# Patient Record
Sex: Female | Born: 2000 | Race: Black or African American | Hispanic: No | Marital: Single | Smoking: Never smoker
Health system: Southern US, Community
[De-identification: ages and names within clinical notes are randomized; demographics above are authoritative.]

## PROBLEM LIST (undated history)

## (undated) DIAGNOSIS — F419 Anxiety disorder, unspecified: Secondary | ICD-10-CM

## (undated) DIAGNOSIS — G039 Meningitis, unspecified: Secondary | ICD-10-CM

## (undated) HISTORY — DX: Meningitis, unspecified: G03.9

---
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
## 2018-03-08 ENCOUNTER — Encounter: Payer: Self-pay | Admitting: Physician Assistant

## 2020-10-03 ENCOUNTER — Ambulatory Visit
Admission: RE | Admit: 2020-10-03 | Discharge: 2020-10-03 | Disposition: A | Discharge status: Home / Self Care | Payer: BC Managed Care – PPO | Source: Ambulatory Visit | Attending: Emergency Medicine | Admitting: Emergency Medicine

## 2020-10-03 ENCOUNTER — Other Ambulatory Visit: Payer: Self-pay

## 2020-10-03 VITALS — BP 133/90 | HR 75 | Temp 98.1°F | Resp 16

## 2020-10-03 DIAGNOSIS — Z20822 Contact with and (suspected) exposure to covid-19: Secondary | ICD-10-CM

## 2020-10-03 DIAGNOSIS — J069 Acute upper respiratory infection, unspecified: Secondary | ICD-10-CM

## 2020-10-03 DIAGNOSIS — Z1152 Encounter for screening for COVID-19: Secondary | ICD-10-CM

## 2020-10-03 HISTORY — DX: Anxiety disorder, unspecified: F41.9

## 2020-10-03 NOTE — ED Triage Notes (Signed)
Patient in with complaints of runny nose,cough, body aches and headache x 2 days. Patient states that she had a productive cough with yellowish/green sputum that started this morning. Patient denies any fever. TheraFlu and Robitussin taken at home.

## 2020-10-03 NOTE — ED Provider Notes (Signed)
[facility]    CSN: [id number] Arrival date & time: 10/03/20  1401      History   Chief Complaint Chief Complaint  Patient presents with  . Cough  . Headache  . Nasal Congestion  . Generalized Body Aches  . Appointment    2 pm    HPI [place] Melanie a 19 y.o. Conway  Subjective:   Melanie Conway Melanie a 19 y.o. Conway here for evaluation of a cough.  The cough Melanie non-productive, without wheezing, dyspnea or hemoptysis and Melanie aggravated by cold air and pollens. Onset of symptoms was 2 days ago, stable since that time.  Associated symptoms include nasal congestion, ear popping. Patient does not have a history of asthma. Patient has not had recent travel. Patient does not have a history of smoking. Patient  has not had a previous chest x-ray. Patient has not had a PPD done. The following portions of the patient's history were reviewed and updated as appropriate: allergies, current medications, past family history, past medical history, past social history, past surgical history and problem list.      Past Medical History:  Diagnosis Date  . Anxiety     There are no problems to display for this patient.   History reviewed. No pertinent surgical history.  OB History   No obstetric history on file.      Home Medications    Prior to Admission medications   Not on File    Family History Family History  Problem Relation Age of Onset  . Anemia Mother     Social History Social History   Tobacco Use  . Smoking status: Never Smoker  . Smokeless tobacco: Never Used  Vaping Use  . Vaping Use: Never used  Substance Use Topics  . Alcohol use: Yes    Comment: a couple of times a week  . Drug use: Yes    Types: Marijuana    Comment: daily     Allergies   Patient has no known allergies.   Review of Systems Review of Systems  Constitutional: Negative for fatigue and fever.  HENT: Positive for congestion and ear pain. Negative for ear  discharge, sinus pain, sore throat and voice change.   Eyes: Negative for pain, redness and visual disturbance.  Respiratory: Positive for cough. Negative for shortness of breath.   Cardiovascular: Negative for chest pain and palpitations.  Gastrointestinal: Negative for abdominal pain, diarrhea and vomiting.  Musculoskeletal: Negative for arthralgias and myalgias.  Skin: Negative for rash and wound.  Neurological: Negative for syncope and headaches.     Physical Exam Triage Vital Signs ED Triage Vitals  Enc Vitals Group     BP      Pulse      Resp      Temp      Temp src      SpO2      Weight      Height      Head Circumference      Peak Flow      Pain Score      Pain Loc      Pain Edu?      Excl. in GC?    No data found.  Updated Vital Signs BP 133/90 (BP Location: Left Arm)   Pulse 75   Temp 98.1 F (36.7 C) (Oral)   Resp 16   LMP 09/30/2020   SpO2 99%   Visual Acuity Right Eye Distance:   Left Eye  Distance:   Bilateral Distance:    Right Eye Near:   Left Eye Near:    Bilateral Near:     Physical Exam Constitutional:      General: She Melanie not in acute distress.    Appearance: She Melanie not ill-appearing or diaphoretic.  HENT:     Head: Normocephalic and atraumatic.     Right Ear: Tympanic membrane and ear canal normal.     Left Ear: Tympanic membrane and ear canal normal.     Mouth/Throat:     Mouth: Mucous membranes are moist.     Pharynx: Oropharynx Melanie clear. No oropharyngeal exudate or posterior oropharyngeal erythema.  Eyes:     General: No scleral icterus.    Extraocular Movements: Extraocular movements intact.     Conjunctiva/sclera: Conjunctivae normal.     Pupils: Pupils are equal, round, and reactive to light.  Neck:     Comments: Trachea midline, negative JVD Cardiovascular:     Rate and Rhythm: Normal rate and regular rhythm.     Heart sounds: No murmur heard.  No gallop.   Pulmonary:     Effort: Pulmonary effort Melanie normal. No  respiratory distress.     Breath sounds: No wheezing, rhonchi or rales.  Musculoskeletal:     Cervical back: Neck supple. No tenderness.  Lymphadenopathy:     Cervical: No cervical adenopathy.  Skin:    Capillary Refill: Capillary refill takes less than 2 seconds.     Coloration: Skin Melanie not jaundiced or pale.     Findings: No rash.  Neurological:     General: No focal deficit present.     Mental Status: She Melanie alert and oriented to person, place, and time.      UC Treatments / Results  Labs (all labs ordered are listed, but only abnormal results are displayed) Labs Reviewed  NOVEL CORONAVIRUS, NAA    EKG   Radiology No results found.  Procedures Procedures (including critical care time)  Medications Ordered in UC Medications - No data to display  Initial Impression / Assessment and Plan / UC Course  I have reviewed the triage vital signs and the nursing notes.  Pertinent labs & imaging results that were available during my care of the patient were reviewed by me and considered in my medical decision making (see chart for details).     Patient afebrile, nontoxic, with SpO2 99%.  Covid PCR pending.  Patient to quarantine until results are back.  We will treat supportively as outlined below.  Return precautions discussed, patient verbalized understanding and Melanie agreeable to plan. Final Clinical Impressions(s) / UC Diagnoses   Final diagnoses:  URI with cough and congestion     Discharge Instructions     Your COVID test Melanie pending - it Melanie important to quarantine / isolate at home until your results are back. If you test positive and would like further evaluation for persistent or worsening symptoms, you may schedule an E-visit or virtual (video) visit throughout the [facility] app or website.  PLEASE NOTE: If you develop severe chest pain or shortness of breath please go to the ER or call 9-1-1 for further evaluation --> DO NOT schedule electronic or  virtual visits for this. Please call our office for further guidance / recommendations as needed.  For information about the Covid vaccine, please visit [email or web address]    ED Prescriptions    None     PDMP not reviewed this encounter.   [place], [place],  PA-C 10/03/20 1500

## 2020-10-03 NOTE — Discharge Instructions (Addendum)
Your COVID test is pending - it is important to quarantine / isolate at home until your results are back. °If you test positive and would like further evaluation for persistent or worsening symptoms, you may schedule an E-visit or virtual (video) visit throughout the [facility] MyChart app or website. ° °PLEASE NOTE: If you develop severe chest pain or shortness of breath please go to the ER or call 9-1-1 for further evaluation --> DO NOT schedule electronic or virtual visits for this. °Please call our office for further guidance / recommendations as needed. ° °For information about the Covid vaccine, please visit [email or web address] °

## 2020-10-04 ENCOUNTER — Encounter: Payer: Self-pay | Admitting: Physician Assistant

## 2020-10-05 LAB — SARS-COV-2, NAA 2 DAY TAT

## 2020-10-05 LAB — NOVEL CORONAVIRUS, NAA: SARS-CoV-2, NAA: NOT DETECTED

## 2023-12-17 ENCOUNTER — Other Ambulatory Visit: Payer: Self-pay

## 2023-12-17 ENCOUNTER — Encounter (HOSPITAL_BASED_OUTPATIENT_CLINIC_OR_DEPARTMENT_OTHER): Payer: Self-pay | Admitting: Emergency Medicine

## 2023-12-17 ENCOUNTER — Emergency Department (HOSPITAL_BASED_OUTPATIENT_CLINIC_OR_DEPARTMENT_OTHER)
Admission: EM | Admit: 2023-12-17 | Discharge: 2023-12-17 | Disposition: A | Discharge status: Home / Self Care | Payer: BC Managed Care – PPO | Attending: Emergency Medicine | Admitting: Emergency Medicine

## 2023-12-17 DIAGNOSIS — R1033 Periumbilical pain: Secondary | ICD-10-CM | POA: Diagnosis present

## 2023-12-17 NOTE — ED Provider Notes (Signed)
 [place] EMERGENCY DEPARTMENT AT [facility] Provider Note   CSN: [id number] Arrival date & time: 12/17/23  1118     History  Chief Complaint  Patient presents with   Possible Hernia    Melanie Conway is a 23 y.o. female.  Patient presents to the emergency department today for evaluation of periumbilical pain.  Patient has felt a small lump at the upper portion of the umbilicus.  This area is been tender to palpation.  No drainage.  No fevers.  No vomiting.  She has had some diarrhea which is chronically intermittent for the patient.  She was concerned that she developed a hernia she started feeling this after she did some lifting yesterday.  Swelling is more pronounced with standing.  She denies history of piercings or other trauma to the area.       Home Medications Prior to Admission medications   Medication Sig Start Date End Date Taking? Authorizing Provider  etonogestrel-ethinyl estradiol (NUVARING) 0.12-0.015 MG/24HR vaginal ring Place 1 each vaginally every 28 (twenty-eight) days. Insert vaginally and leave in place for 3 consecutive weeks, then remove for 1 week.    [provider]      Allergies    Patient has no known allergies.    Review of Systems   Review of Systems  Physical Exam Updated Vital Signs BP 120/80   Pulse 99   Temp 97.6 F (36.4 C)   Resp 16   Wt 81.6 kg   LMP 12/14/2023   SpO2 100%   BMI 29.95 kg/m  Physical Exam Vitals and nursing note reviewed.  Constitutional:      Appearance: She is well-developed.  HENT:     Head: Normocephalic and atraumatic.  Eyes:     Conjunctiva/sclera: Conjunctivae normal.  Pulmonary:     Effort: No respiratory distress.  Abdominal:     Tenderness: There is abdominal tenderness.     Comments: Patient with focal tenderness at the umbilicus, and the 11-12 o'clock position.  With standing, a small nodule is noted about the size of a pencil eraser.  This is not grossly apparent.  It feels  more cystic.  Not movable like a hernia would be.  No overlying erythema.  Not particularly fluctuant.  Musculoskeletal:     Cervical back: Normal range of motion and neck supple.  Skin:    General: Skin is warm and dry.  Neurological:     Mental Status: She is alert.     ED Results / Procedures / Treatments   Labs (all labs ordered are listed, but only abnormal results are displayed) Labs Reviewed - No data to display  EKG None  Radiology No results found.  Procedures Procedures    Medications Ordered in ED Medications - No data to display  ED Course/ Medical Decision Making/ A&P    Patient seen and examined. History obtained directly from patient.   Labs/EKG: None ordered  Imaging: Bedside ultrasound was used to evaluate for any fluid collections in the area of the small lump.  I do not see any significant collection of fluids or signs of associated cellulitis in the area.  EMERGENCY DEPARTMENT US  SOFT TISSUE INTERPRETATION Study: Limited Soft Tissue Ultrasound  INDICATIONS: Pain Multiple views of the body part were obtained in real-time with a multi-frequency linear probe  PERFORMED BY: Myself IMAGES ARCHIVED?: No SIDE:Midline BODY PART:Abdominal wall INTERPRETATION:  Normal soft tissue ultrasound   Medications/Fluids: None ordered  Most recent vital signs reviewed and are as  follows: BP 120/80   Pulse 99   Temp 97.6 F (36.4 C)   Resp 16   Wt 81.6 kg   LMP 12/14/2023   SpO2 100%   BMI 29.95 kg/m   Initial impression: Umbilical pain, possible small cyst, no indication for drainage, low concern for hernia  Home treatment plan: OTC anti-inflammatories/Tylenol, warm compress  Return instructions discussed with patient: We discussed possibility of the area becoming more enlarged, red, tender.  In this case she would need to return for recheck and consideration for possible aspiration.  Follow-up instructions discussed with patient: Follow-up with  PCP if not improved in 1 week                                Medical Decision Making  Patient with a small tender nodule at the upper portion of the umbilicus.  Area feels more cystic.  Does not appear to be particularly inflamed or infected this point.  No indication for drainage or antibiotics.  Cannot entirely rule out small fat-containing hernia, however appearance and feel on exam makes this less likely.  Patient certainly does not have any obstructive symptoms today.  Do not feel that CT would be beneficial at this time.        Final Clinical Impression(s) / ED Diagnoses Final diagnoses:  Umbilical pain    Rx / DC Orders ED Discharge Orders     None         Desiderio Chew, PA-C 12/17/23 1157    Dean Clarity, MD 12/17/23 1216

## 2023-12-17 NOTE — Discharge Instructions (Signed)
 Please use over the counter ibuprofen  or naproxen on a scheduled basis over the next few days.  You may also use warm compresses.  Return to the emergency department if you develop more pronounced swelling, redness, drainage around the bellybutton.  In this case you may have a cyst or abscess that needs to be drained.

## 2023-12-17 NOTE — ED Triage Notes (Signed)
 Pt c/o umbilicus pain, endorses concern for hernia.

## 2024-01-25 ENCOUNTER — Inpatient Hospital Stay: Admission: RE | Admit: 2024-01-25 | Payer: Self-pay | Source: Ambulatory Visit

## 2024-10-05 ENCOUNTER — Ambulatory Visit
Admission: EM | Admit: 2024-10-05 | Discharge: 2024-10-05 | Disposition: A | Discharge status: Home / Self Care | Attending: Family Medicine | Admitting: Family Medicine

## 2024-10-05 DIAGNOSIS — B9789 Other viral agents as the cause of diseases classified elsewhere: Secondary | ICD-10-CM

## 2024-10-05 DIAGNOSIS — J988 Other specified respiratory disorders: Secondary | ICD-10-CM

## 2024-10-05 LAB — POC COVID19/FLU A&B COMBO
Covid Antigen, POC: NEGATIVE
Influenza A Antigen, POC: NEGATIVE
Influenza B Antigen, POC: NEGATIVE

## 2024-10-05 MED ORDER — CETIRIZINE HCL 10 MG PO TABS: 10.0000 mg | ORAL_TABLET | Freq: Every day | ORAL | 0 refills | Status: AC

## 2024-10-05 MED ORDER — PSEUDOEPHEDRINE HCL 30 MG PO TABS: 30.0000 mg | ORAL_TABLET | Freq: Three times a day (TID) | ORAL | 0 refills | Status: AC | PRN

## 2024-10-05 MED ORDER — PROMETHAZINE-DM 6.25-15 MG/5ML PO SYRP: 5.0000 mL | ORAL_SOLUTION | Freq: Every evening | ORAL | 0 refills | Status: AC | PRN

## 2024-10-05 MED ORDER — IBUPROFEN 600 MG PO TABS: 600.0000 mg | ORAL_TABLET | Freq: Four times a day (QID) | ORAL | 0 refills | Status: AC | PRN

## 2024-10-05 NOTE — Discharge Instructions (Signed)
We will manage this as a viral respiratory illness. For sore throat or cough try using a honey-based tea. Use 3 teaspoons of honey with juice squeezed from half lemon. Place shaved pieces of ginger into 1/2-1 cup of water and warm over stove top. Then mix the ingredients and repeat every 4 hours as needed. Please take ibuprofen 600mg  every 6 hours with food alternating with OR taken together with Tylenol 500mg -650mg  every 6 hours for throat pain, fevers, aches and pains. Hydrate very well with at least 2 liters of water. Eat light meals such as soups (chicken and noodles, vegetable, chicken and wild rice).  Do not eat foods that you are allergic to.  Taking an antihistamine like Zyrtec (10mg  daily) can help against postnasal drainage, sinus congestion which can cause sinus pain, sinus headaches, throat pain, painful swallowing, coughing.  You can take this together with pseudoephedrine (Sudafed) at a dose of 30mg  3 times a day or twice daily as needed for the same kind of nasal drip, congestion.  Use cough syrup as needed.

## 2024-10-05 NOTE — ED Triage Notes (Signed)
 Pt c/o cough, head/chest congestion x 2 days-denies fever-taking theraflu and dayquil-NAD-steady gait

## 2024-10-05 NOTE — ED Provider Notes (Signed)
 [facility] - URGENT CARE C[facility]  Note:  This document was prepared using Conservation officer, historic buildings and may include unintentional dictation errors.  MRN: [id number] DOB: Dec 06, 2001  Subjective:   Melanie Conway is a 23 y.o. female presenting for 2 day history of bilateral ear fullness, sinus congestion, scratchy throat, chest congestion, labored breathing, mild chest pain. No asthma. No smoking of any kind including cigarettes, cigars, vaping, marijuana use.    No current facility-administered medications for this encounter.  Current Outpatient Medications:    busPIRone (BUSPAR) 5 MG tablet, TAKE 1 TABLET BY MOUTH TWICE A DAY AS NEEDED; Duration: 90, Disp: , Rfl:    escitalopram (LEXAPRO) 10 MG tablet, Take 10 mg by mouth daily., Disp: , Rfl:    etonogestrel-ethinyl estradiol (NUVARING) 0.12-0.015 MG/24HR vaginal ring, Place 1 each vaginally every 28 (twenty-eight) days. Insert vaginally and leave in place for 3 consecutive weeks, then remove for 1 week., Disp: , Rfl:    No Known Allergies  Past Medical History:  Diagnosis Date   Anxiety    Meningitis      Past Surgical History:  Procedure Laterality Date   WISDOM TOOTH EXTRACTION     per pt    Family History  Problem Relation Age of Onset   Anemia Mother    Allergies Brother    Cancer Maternal Grandfather     Social History   Tobacco Use   Smoking status: Never   Smokeless tobacco: Never  Vaping Use   Vaping status: Former  Substance Use Topics   Alcohol use: Yes    Comment: occ   Drug use: Not Currently    Types: Marijuana    ROS   Objective:   Vitals: BP 130/80 (BP Location: Left Arm)   Pulse 90   Temp 99.1 F (37.3 C) (Oral)   Resp 16   LMP 09/27/2024   SpO2 97%   Physical Exam Constitutional:      General: She is not in acute distress.    Appearance: Normal appearance. She is well-developed and normal weight. She is not ill-appearing, toxic-appearing or diaphoretic.  HENT:      Head: Normocephalic and atraumatic.     Right Ear: Tympanic membrane, ear canal and external ear normal. No drainage or tenderness. No middle ear effusion. There is no impacted cerumen. Tympanic membrane is not erythematous or bulging.     Left Ear: Tympanic membrane, ear canal and external ear normal. No drainage or tenderness.  No middle ear effusion. There is no impacted cerumen. Tympanic membrane is not erythematous or bulging.     Nose: Nose normal. No congestion or rhinorrhea.     Mouth/Throat:     Mouth: Mucous membranes are moist. No oral lesions.     Pharynx: No pharyngeal swelling, oropharyngeal exudate, posterior oropharyngeal erythema or uvula swelling.     Tonsils: No tonsillar exudate or tonsillar abscesses. 0 on the right. 0 on the left.  Eyes:     General: No scleral icterus.       Right eye: No discharge.        Left eye: No discharge.     Extraocular Movements: Extraocular movements intact.     Right eye: Normal extraocular motion.     Left eye: Normal extraocular motion.     Conjunctiva/sclera: Conjunctivae normal.  Cardiovascular:     Rate and Rhythm: Normal rate and regular rhythm.     Heart sounds: Normal heart sounds. No murmur heard.    No friction  rub. No gallop.  Pulmonary:     Effort: Pulmonary effort is normal. No respiratory distress.     Breath sounds: No stridor. No wheezing, rhonchi or rales.  Chest:     Chest wall: No tenderness.  Musculoskeletal:     Cervical back: Normal range of motion and neck supple.  Lymphadenopathy:     Cervical: No cervical adenopathy.  Skin:    General: Skin is warm and dry.  Neurological:     General: No focal deficit present.     Mental Status: She is alert and oriented to person, place, and time.  Psychiatric:        Mood and Affect: Mood normal.        Behavior: Behavior normal.    Results for orders placed or performed during the hospital encounter of 10/05/24 (from the past 24 hours)  POC Covid19/Flu A&B Antigen      Status: None   Collection Time: 10/05/24  7:25 PM  Result Value Ref Range   Influenza A Antigen, POC Negative Negative   Influenza B Antigen, POC Negative Negative   Covid Antigen, POC Negative Negative    Assessment and Plan :   PDMP not reviewed this encounter.  1. Viral respiratory infection    Deferred imaging given clear cardiopulmonary exam, hemodynamically stable vital signs.  Suspect viral URI, viral syndrome. Physical exam findings reassuring and vital signs stable for discharge. Advised supportive care, offered symptomatic relief. Counseled patient on potential for adverse effects with medications prescribed/recommended today, ER and return-to-clinic precautions discussed, patient verbalized understanding.     Christopher Savannah, [place] 10/05/24 8058

## 2025-01-05 ENCOUNTER — Encounter (HOSPITAL_BASED_OUTPATIENT_CLINIC_OR_DEPARTMENT_OTHER): Payer: Self-pay

## 2025-01-05 ENCOUNTER — Emergency Department (HOSPITAL_BASED_OUTPATIENT_CLINIC_OR_DEPARTMENT_OTHER)
Admission: EM | Admit: 2025-01-05 | Discharge: 2025-01-05 | Disposition: A | Discharge status: Home / Self Care | Source: Ambulatory Visit | Attending: Emergency Medicine | Admitting: Emergency Medicine

## 2025-01-05 ENCOUNTER — Other Ambulatory Visit: Payer: Self-pay

## 2025-01-05 DIAGNOSIS — S0990XA Unspecified injury of head, initial encounter: Secondary | ICD-10-CM | POA: Diagnosis present

## 2025-01-05 DIAGNOSIS — S060XAA Concussion with loss of consciousness status unknown, initial encounter: Secondary | ICD-10-CM | POA: Insufficient documentation

## 2025-01-05 DIAGNOSIS — W000XXA Fall on same level due to ice and snow, initial encounter: Secondary | ICD-10-CM | POA: Insufficient documentation

## 2025-01-05 MED ORDER — DEXAMETHASONE 4 MG PO TABS
10.0000 mg | ORAL_TABLET | Freq: Once | ORAL | Status: AC
  Administered 2025-01-05: 10 mg via ORAL
  Filled 2025-01-05: qty 3

## 2025-01-05 MED ORDER — PROCHLORPERAZINE EDISYLATE 10 MG/2ML IJ SOLN
10.0000 mg | Freq: Once | INTRAMUSCULAR | Status: AC
  Administered 2025-01-05: 10 mg via INTRAMUSCULAR
  Filled 2025-01-05: qty 2

## 2025-01-05 MED ORDER — KETOROLAC TROMETHAMINE 15 MG/ML IJ SOLN
15.0000 mg | Freq: Once | INTRAMUSCULAR | Status: AC
  Administered 2025-01-05: 15 mg via INTRAMUSCULAR
  Filled 2025-01-05: qty 1

## 2025-01-05 MED ORDER — DIPHENHYDRAMINE HCL 50 MG/ML IJ SOLN
25.0000 mg | Freq: Once | INTRAMUSCULAR | Status: AC
  Administered 2025-01-05: 25 mg via INTRAMUSCULAR
  Filled 2025-01-05: qty 1

## 2025-01-05 NOTE — Discharge Instructions (Signed)
 I have given you information to follow-up with the sports medicine clinic for your headaches.  They have some extra expertise in postconcussive injuries.  As we discussed please return for sudden worsening headache one-sided numbness or weakness or difficulty speech or swallowing.  I think you have also strained your trapezius muscle.  Typically this improves with medications like ibuprofen  or naproxen.  Max dosing of the over-the-counter medications as listed below.  Take 4 over the counter ibuprofen  tablets 3 times a day or 2 over-the-counter naproxen tablets twice a day for pain. Also take tylenol 1000mg (2 extra strength) four times a day.

## 2025-01-05 NOTE — ED Triage Notes (Signed)
 Continued neck and right shoulder pain with head pressure since fall 01/02/2025. Was seen by urgent care, diag with post concussive syndrome and sent for imaging. Slid down steps of porch and hit back of head. Negative LOC.

## 2025-01-05 NOTE — ED Provider Notes (Signed)
 " Melanie Conway Provider Note   CSN: [id number] Arrival date & time: 01/05/25  9167     Patient presents with: Head Injury   Melanie Conway is a 24 y.o. female.   24 yo F with a cc of with a chief complaint of a fall.  Patient slipped on the ice and struck the back of her head about 3 days ago.  She has had persistent headaches especially to the posterior aspect of the head and along the right side of her neck.  She feels like at times she has trouble with her coordination and has some slow thinking.  She went to urgent care yesterday and they told her she should come to the ED for advanced imaging.  She denies one-sided numbness or weakness difficulty speech or swallowing.  Denies confusion denies vomiting.  Denies blood thinner use.   Head Injury      Prior to Admission medications  Medication Sig Start Date End Date Taking? Authorizing Provider  busPIRone (BUSPAR) 5 MG tablet TAKE 1 TABLET BY MOUTH TWICE A DAY AS NEEDED; Duration: 90    [provider]  cetirizine  (ZYRTEC  ALLERGY) 10 MG tablet Take 1 tablet (10 mg total) by mouth daily. 10/05/24   Melanie Savannah, PA-C  escitalopram (LEXAPRO) 10 MG tablet Take 10 mg by mouth daily.    [provider]  etonogestrel-ethinyl estradiol (NUVARING) 0.12-0.015 MG/24HR vaginal ring Place 1 each vaginally every 28 (twenty-eight) days. Insert vaginally and leave in place for 3 consecutive weeks, then remove for 1 week.    [provider]  ibuprofen  (ADVIL ) 600 MG tablet Take 1 tablet (600 mg total) by mouth every 6 (six) hours as needed. 10/05/24   Melanie Savannah, PA-C  promethazine -dextromethorphan (PROMETHAZINE -DM) 6.25-15 MG/5ML syrup Take 5 mLs by mouth at bedtime as needed for cough. 10/05/24   Melanie Savannah, PA-C  pseudoephedrine  (SUDAFED) 30 MG tablet Take 1 tablet (30 mg total) by mouth every 8 (eight) hours as needed for congestion. 10/05/24   Melanie Savannah, PA-C     Allergies: Patient has no known allergies.    Review of Systems  Updated Vital Signs BP (!) 136/93   Pulse 84   Temp 98.6 F (37 C) (Oral)   Resp 16   Ht 5' 3 (1.6 m)   Wt 81.6 kg   LMP 12/08/2024 (Approximate)   SpO2 100%   BMI 31.89 kg/m   Physical Exam Vitals and nursing note reviewed.  Constitutional:      General: She is not in acute distress.    Appearance: She is well-developed. She is not diaphoretic.  HENT:     Head: Normocephalic and atraumatic.  Eyes:     Pupils: Pupils are equal, round, and reactive to light.  Cardiovascular:     Rate and Rhythm: Normal rate and regular rhythm.     Heart sounds: No murmur heard.    No friction rub. No gallop.  Pulmonary:     Effort: Pulmonary effort is normal.     Breath sounds: No wheezing or rales.  Abdominal:     General: There is no distension.     Palpations: Abdomen is soft.     Tenderness: There is no abdominal tenderness.  Musculoskeletal:        General: Tenderness present.     Cervical back: Normal range of motion and neck supple.     Comments: No obvious midline C-spine tenderness step-offs or deformities.  She is able  to rotate her head 45 degrees no direction without midline pain.  Pain mostly reproduced with palpation of the trapezius muscle belly on the right.  Skin:    General: Skin is warm and dry.  Neurological:     Mental Status: She is alert and oriented to person, place, and time.     GCS: GCS eye subscore is 4. GCS verbal subscore is 5. GCS motor subscore is 6.     Cranial Nerves: Cranial nerves 2-12 are intact.     Sensory: Sensation is intact.     Motor: Motor function is intact.     Coordination: Coordination is intact.     Comments: Benign neurologic exam  Psychiatric:        Behavior: Behavior normal.     (all labs ordered are listed, but only abnormal results are displayed) Labs Reviewed - No data to display  EKG: None  Radiology: No results found.   Procedures    Medications Ordered in the ED  ketorolac  (TORADOL ) 15 MG/ML injection 15 mg (has no administration in time range)  prochlorperazine  (COMPAZINE ) injection 10 mg (has no administration in time range)  diphenhydrAMINE  (BENADRYL ) injection 25 mg (has no administration in time range)  dexamethasone  (DECADRON ) tablet 10 mg (has no administration in time range)                                    Medical Decision Making Risk Prescription drug management.   24 yo F with a chief complaint of a closed head injury.  This happened about 3 to 4 days ago.  Technically she is [place] head CT rules negative and has been since the injury.  She was sent from urgent care to have CT imaging performed.  I did discuss risks and benefits of this with her.  She is currently electing to not have imaging performed.  Will treat with a headache cocktail.  Give information for sports medicine clinic follow-up.  8:59 AM:  I have discussed the diagnosis/risks/treatment options with the patient.  Evaluation and diagnostic testing in the emergency department does not suggest an emergent condition requiring admission or immediate intervention beyond what has been performed at this time.  They will follow up with PCP, sports med. We also discussed returning to the ED immediately if new or worsening sx occur. We discussed the sx which are most concerning (e.g., sudden worsening pain, fever, inability to tolerate by mouth) that necessitate immediate return. Medications administered to the patient during their visit and any new prescriptions provided to the patient are listed below.  Medications given during this visit Medications  ketorolac  (TORADOL ) 15 MG/ML injection 15 mg (has no administration in time range)  prochlorperazine  (COMPAZINE ) injection 10 mg (has no administration in time range)  diphenhydrAMINE  (BENADRYL ) injection 25 mg (has no administration in time range)  dexamethasone  (DECADRON ) tablet 10 mg (has no  administration in time range)     The patient appears reasonably screen and/or stabilized for discharge and I doubt any other medical condition or other [facility] requiring further screening, evaluation, or treatment in the ED at this time prior to discharge.       Final diagnoses:  Concussion with unknown loss of consciousness status, initial encounter    ED Discharge Orders     None          Emil Share, DO 01/05/25 9140  "
